# Patient Record
Sex: Male | Born: 1960
Health system: Southern US, Community
[De-identification: ages and names within clinical notes are randomized; demographics above are authoritative.]

---

## 2012-02-26 ENCOUNTER — Ambulatory Visit (HOSPITAL_BASED_OUTPATIENT_CLINIC_OR_DEPARTMENT_OTHER)
Admission: RE | Admit: 2012-02-26 | Discharge: 2012-02-26 | Disposition: A | Discharge status: Home / Self Care | Payer: Self-pay | Source: Ambulatory Visit | Attending: Occupational Medicine | Admitting: Occupational Medicine

## 2012-02-26 ENCOUNTER — Other Ambulatory Visit: Payer: Self-pay | Admitting: Occupational Medicine

## 2012-02-26 DIAGNOSIS — Z0000 Encounter for general adult medical examination without abnormal findings: Secondary | ICD-10-CM | POA: Insufficient documentation

## 2013-05-18 IMAGING — CR DG CHEST 1V
1 series · 1 of 1 positions shown · non-contrast
Comparison: None

CLINICAL DATA: Physical exam.

CHEST - 1 VIEW

[w chest pa]
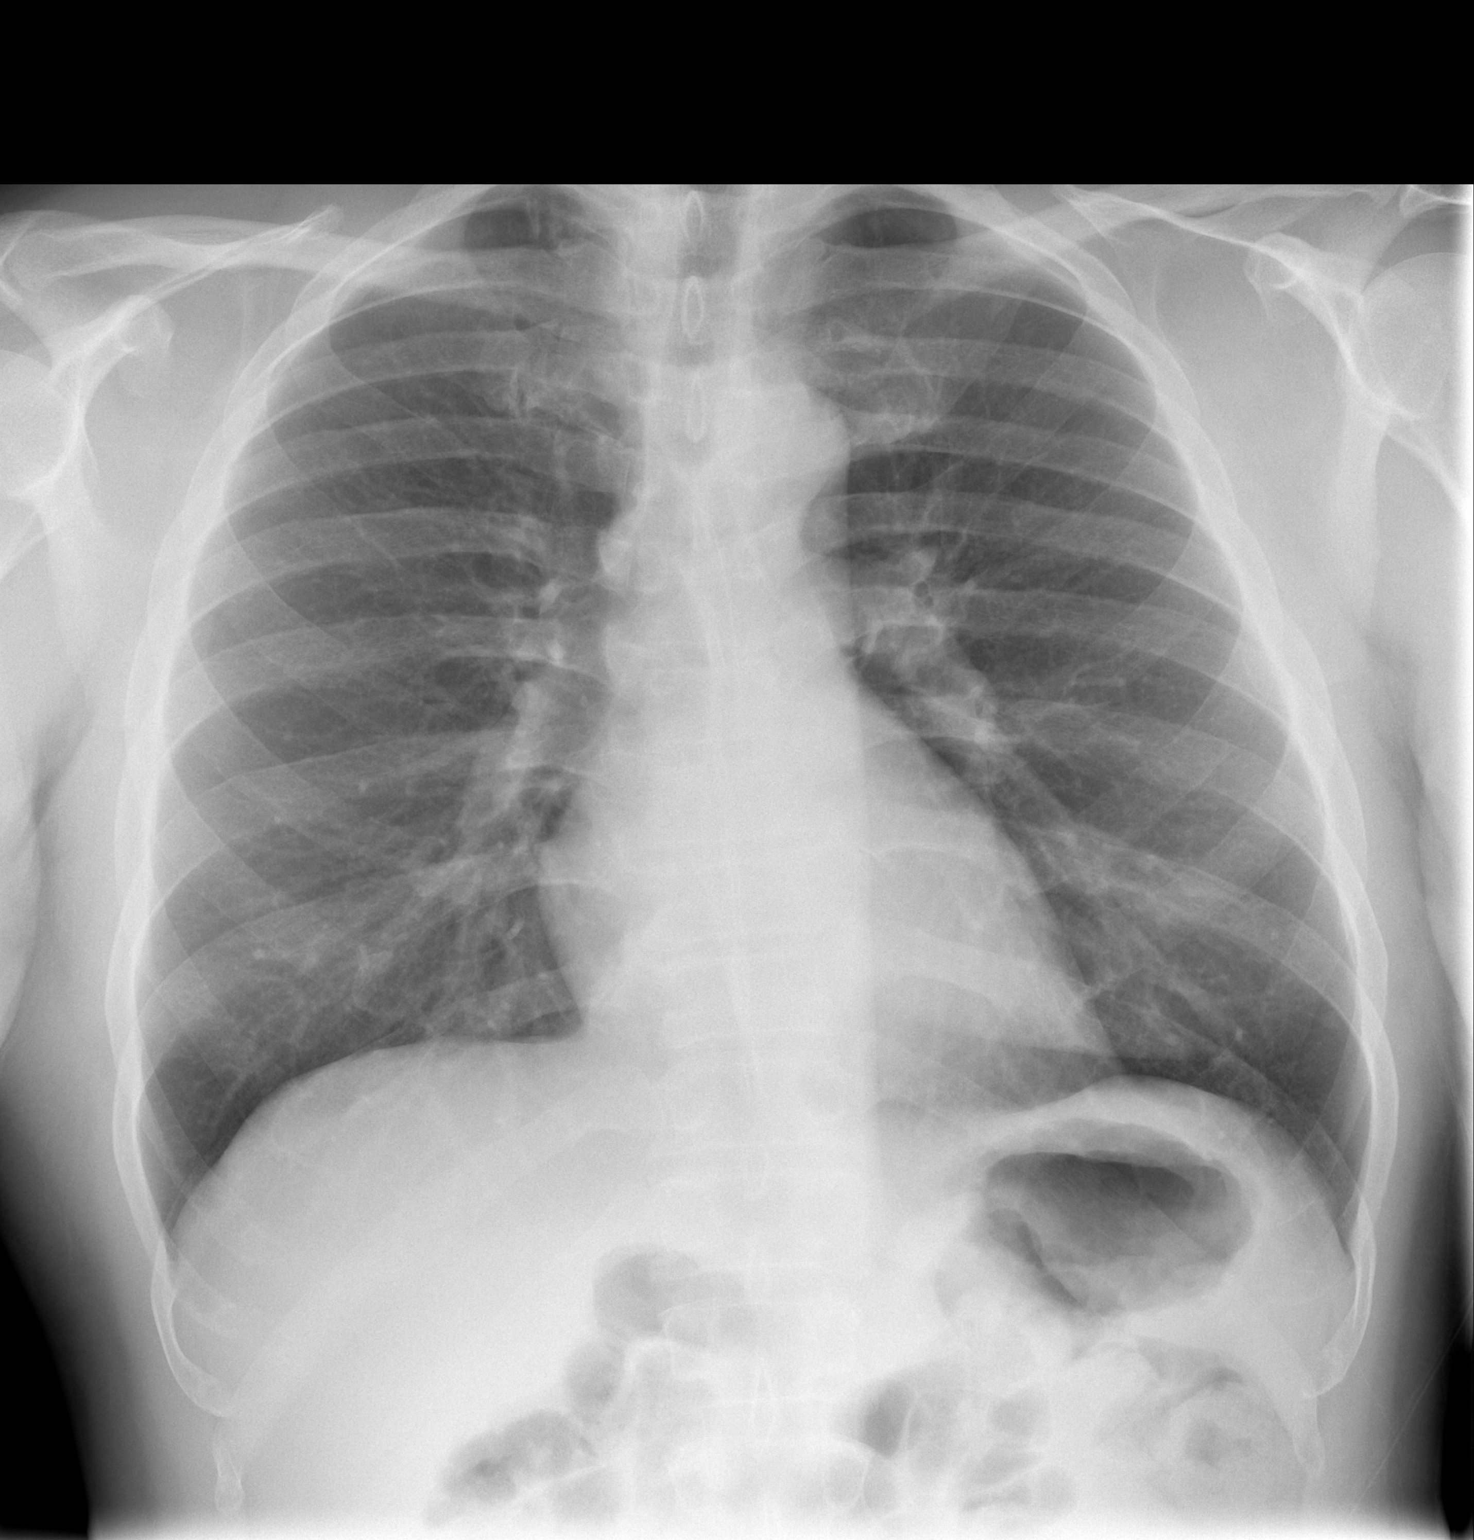

[1 of 1 positions shown; findings below may reference images not displayed]

FINDINGS: Heart and mediastinal contours are within normal limits.
No focal opacities or effusions.  No acute bony abnormality.
IMPRESSION: No active cardiopulmonary disease.

## 2021-06-09 ENCOUNTER — Ambulatory Visit: Payer: Self-pay | Attending: Internal Medicine

## 2021-06-09 DIAGNOSIS — Z23 Encounter for immunization: Secondary | ICD-10-CM

## 2021-06-09 NOTE — Progress Notes (Signed)
° °  [facility] Vacc[facility]  Name:  Spencer Bolton    MRN: [id number] DOB: 1960-06-23  06/09/2021  [name] was observed post Covid-19 immunization for 15 minutes without incident. He was provided with Vaccine Information Sheet and instruction to access the V-Safe system.   [name] was instructed to call 911 with any severe reactions post vaccine: Difficulty breathing  Swelling of face and throat  A fast heartbeat  A bad rash all over body  Dizziness and weakness   Immunizations Administered     Name Date Dose VIS Date Route   Pfizer Covid-19 Vaccine Bivalent Booster 06/09/2021  9:27 AM 0.3 mL 02/16/2021 Intramuscular   Manufacturer: [employer], [employer]   Lot: [id number]   NDC: [phone]

## 2021-06-10 ENCOUNTER — Other Ambulatory Visit (HOSPITAL_BASED_OUTPATIENT_CLINIC_OR_DEPARTMENT_OTHER): Payer: Self-pay

## 2021-06-10 MED ORDER — PFIZER COVID-19 VAC BIVALENT 30 MCG/0.3ML IM SUSP
INTRAMUSCULAR | 0 refills | Status: AC
  Filled 2021-06-10: qty 0.3, 1d supply, fill #0

## 2024-04-04 ENCOUNTER — Other Ambulatory Visit (HOSPITAL_BASED_OUTPATIENT_CLINIC_OR_DEPARTMENT_OTHER): Payer: Self-pay

## 2024-04-04 MED ORDER — FLUZONE 0.5 ML IM SUSY
0.5000 mL | PREFILLED_SYRINGE | Freq: Once | INTRAMUSCULAR | 0 refills | Status: AC
  Filled 2024-04-04: qty 0.5, 1d supply, fill #0

## 2024-04-07 ENCOUNTER — Other Ambulatory Visit (HOSPITAL_BASED_OUTPATIENT_CLINIC_OR_DEPARTMENT_OTHER): Payer: Self-pay
